# Patient Record
Sex: Male | Born: 1970 | Race: White | Hispanic: No | Marital: Single | Smoking: Current every day smoker
Health system: Southern US, Community
[De-identification: ages and names within clinical notes are randomized; demographics above are authoritative.]

---

## 2016-06-13 ENCOUNTER — Inpatient Hospital Stay (HOSPITAL_COMMUNITY): Payer: Self-pay | Admitting: Certified Registered"

## 2016-06-13 ENCOUNTER — Emergency Department (HOSPITAL_COMMUNITY): Payer: Self-pay

## 2016-06-13 ENCOUNTER — Inpatient Hospital Stay (HOSPITAL_COMMUNITY)
Admission: EM | Admit: 2016-06-13 | Discharge: 2016-06-19 | DRG: 494 | Disposition: A | Discharge status: Home / Self Care | Payer: Self-pay | Attending: Surgery | Admitting: Surgery

## 2016-06-13 ENCOUNTER — Encounter (HOSPITAL_COMMUNITY): Payer: Self-pay | Admitting: Emergency Medicine

## 2016-06-13 ENCOUNTER — Encounter (HOSPITAL_COMMUNITY): Admission: EM | Disposition: A | Discharge status: Home / Self Care | Payer: Self-pay | Source: Home / Self Care

## 2016-06-13 DIAGNOSIS — Z590 Homelessness: Secondary | ICD-10-CM

## 2016-06-13 DIAGNOSIS — F10129 Alcohol abuse with intoxication, unspecified: Secondary | ICD-10-CM | POA: Diagnosis present

## 2016-06-13 DIAGNOSIS — T148XXA Other injury of unspecified body region, initial encounter: Secondary | ICD-10-CM

## 2016-06-13 DIAGNOSIS — Z8673 Personal history of transient ischemic attack (TIA), and cerebral infarction without residual deficits: Secondary | ICD-10-CM

## 2016-06-13 DIAGNOSIS — S50811A Abrasion of right forearm, initial encounter: Secondary | ICD-10-CM | POA: Diagnosis present

## 2016-06-13 DIAGNOSIS — S60512A Abrasion of left hand, initial encounter: Secondary | ICD-10-CM | POA: Diagnosis present

## 2016-06-13 DIAGNOSIS — S82202B Unspecified fracture of shaft of left tibia, initial encounter for open fracture type I or II: Principal | ICD-10-CM | POA: Diagnosis present

## 2016-06-13 DIAGNOSIS — Z23 Encounter for immunization: Secondary | ICD-10-CM

## 2016-06-13 DIAGNOSIS — Z419 Encounter for procedure for purposes other than remedying health state, unspecified: Secondary | ICD-10-CM

## 2016-06-13 DIAGNOSIS — S0081XA Abrasion of other part of head, initial encounter: Secondary | ICD-10-CM | POA: Diagnosis present

## 2016-06-13 DIAGNOSIS — S0003XA Contusion of scalp, initial encounter: Secondary | ICD-10-CM | POA: Diagnosis present

## 2016-06-13 DIAGNOSIS — S82402B Unspecified fracture of shaft of left fibula, initial encounter for open fracture type I or II: Secondary | ICD-10-CM | POA: Diagnosis present

## 2016-06-13 DIAGNOSIS — T1490XA Injury, unspecified, initial encounter: Secondary | ICD-10-CM

## 2016-06-13 DIAGNOSIS — F17200 Nicotine dependence, unspecified, uncomplicated: Secondary | ICD-10-CM | POA: Diagnosis present

## 2016-06-13 DIAGNOSIS — F17210 Nicotine dependence, cigarettes, uncomplicated: Secondary | ICD-10-CM | POA: Diagnosis present

## 2016-06-13 DIAGNOSIS — S30810A Abrasion of lower back and pelvis, initial encounter: Secondary | ICD-10-CM | POA: Diagnosis present

## 2016-06-13 HISTORY — PX: TIBIA IM NAIL INSERTION: SHX2516

## 2016-06-13 HISTORY — PX: I & D EXTREMITY: SHX5045

## 2016-06-13 LAB — TYPE AND SCREEN
ABO/RH(D): O POS
Antibody Screen: NEGATIVE
UNIT DIVISION: 0
Unit division: 0

## 2016-06-13 LAB — PREPARE FRESH FROZEN PLASMA
Unit division: 0
Unit division: 0

## 2016-06-13 LAB — PROTIME-INR
INR: 1
Prothrombin Time: 13.2 seconds (ref 11.4–15.2)

## 2016-06-13 LAB — I-STAT CG4 LACTIC ACID, ED: Lactic Acid, Venous: 1.6 mmol/L (ref 0.5–1.9)

## 2016-06-13 LAB — URINALYSIS, ROUTINE W REFLEX MICROSCOPIC
Bilirubin Urine: NEGATIVE
Glucose, UA: NEGATIVE mg/dL
Hgb urine dipstick: NEGATIVE
Ketones, ur: NEGATIVE mg/dL
Leukocytes, UA: NEGATIVE
NITRITE: NEGATIVE
Protein, ur: NEGATIVE mg/dL
Specific Gravity, Urine: 1.015 (ref 1.005–1.030)
pH: 6 (ref 5.0–8.0)

## 2016-06-13 LAB — BPAM RBC
BLOOD PRODUCT EXPIRATION DATE: 201806192359
Blood Product Expiration Date: 201806192359
ISSUE DATE / TIME: 201805242136
ISSUE DATE / TIME: 201805242136
UNIT TYPE AND RH: 9500
Unit Type and Rh: 9500

## 2016-06-13 LAB — ABO/RH: ABO/RH(D): O POS

## 2016-06-13 LAB — BPAM FFP
BLOOD PRODUCT EXPIRATION DATE: 201806092359
Blood Product Expiration Date: 201806172359
ISSUE DATE / TIME: 201805242137
ISSUE DATE / TIME: 201805242137
UNIT TYPE AND RH: 6200
Unit Type and Rh: 600

## 2016-06-13 LAB — COMPREHENSIVE METABOLIC PANEL
ALT: 41 U/L (ref 17–63)
AST: 38 U/L (ref 15–41)
Albumin: 3.8 g/dL (ref 3.5–5.0)
Alkaline Phosphatase: 44 U/L (ref 38–126)
Anion gap: 8 (ref 5–15)
BILIRUBIN TOTAL: 0.2 mg/dL — AB (ref 0.3–1.2)
BUN: 12 mg/dL (ref 6–20)
CO2: 22 mmol/L (ref 22–32)
Calcium: 8.1 mg/dL — ABNORMAL LOW (ref 8.9–10.3)
Chloride: 109 mmol/L (ref 101–111)
Creatinine, Ser: 1.26 mg/dL — ABNORMAL HIGH (ref 0.61–1.24)
GFR calc Af Amer: >60 mL/min (ref >60)
Glucose, Bld: 92 mg/dL (ref 65–99)
POTASSIUM: 3.9 mmol/L (ref 3.5–5.1)
Sodium: 139 mmol/L (ref 135–145)
TOTAL PROTEIN: 6.5 g/dL (ref 6.5–8.1)

## 2016-06-13 LAB — CBC
HEMATOCRIT: 41.1 % (ref 39.0–52.0)
Hemoglobin: 13.9 g/dL (ref 13.0–17.0)
MCH: 31.7 pg (ref 26.0–34.0)
MCHC: 33.8 g/dL (ref 30.0–36.0)
MCV: 93.8 fL (ref 78.0–100.0)
PLATELETS: 215 10*3/uL (ref 150–400)
RBC: 4.38 MIL/uL (ref 4.22–5.81)
RDW: 12.9 % (ref 11.5–15.5)
WBC: 7.1 10*3/uL (ref 4.0–10.5)

## 2016-06-13 LAB — ETHANOL: Alcohol, Ethyl (B): 235 mg/dL — ABNORMAL HIGH (ref <5)

## 2016-06-13 LAB — I-STAT CHEM 8, ED
BUN: 13 mg/dL (ref 6–20)
CALCIUM ION: 1.04 mmol/L — AB (ref 1.15–1.40)
Chloride: 106 mmol/L (ref 101–111)
Creatinine, Ser: 1.4 mg/dL — ABNORMAL HIGH (ref 0.61–1.24)
Glucose, Bld: 87 mg/dL (ref 65–99)
HEMATOCRIT: 42 % (ref 39.0–52.0)
Hemoglobin: 14.3 g/dL (ref 13.0–17.0)
Potassium: 3.9 mmol/L (ref 3.5–5.1)
Sodium: 142 mmol/L (ref 135–145)
TCO2: 24 mmol/L (ref 0–100)

## 2016-06-13 SURGERY — INSERTION, INTRAMEDULLARY ROD, TIBIA
Anesthesia: General | Site: Leg Lower | Laterality: Left

## 2016-06-13 MED ORDER — CEFAZOLIN SODIUM-DEXTROSE 2-4 GM/100ML-% IV SOLN
2.0000 g | Freq: Once | INTRAVENOUS | Status: AC
  Administered 2016-06-13: 2 g via INTRAVENOUS

## 2016-06-13 MED ORDER — PROPOFOL 10 MG/ML IV BOLUS
INTRAVENOUS | Status: AC
  Filled 2016-06-13: qty 20

## 2016-06-13 MED ORDER — TETANUS-DIPHTH-ACELL PERTUSSIS 5-2.5-18.5 LF-MCG/0.5 IM SUSP
0.5000 mL | Freq: Once | INTRAMUSCULAR | Status: AC
  Administered 2016-06-13: 0.5 mL via INTRAMUSCULAR

## 2016-06-13 MED ORDER — ROCURONIUM BROMIDE 100 MG/10ML IV SOLN
INTRAVENOUS | Status: DC | PRN
  Administered 2016-06-13: 40 mg via INTRAVENOUS

## 2016-06-13 MED ORDER — SUCCINYLCHOLINE CHLORIDE 20 MG/ML IJ SOLN
INTRAMUSCULAR | Status: DC | PRN
  Administered 2016-06-13: 60 mg via INTRAVENOUS

## 2016-06-13 MED ORDER — FENTANYL CITRATE (PF) 100 MCG/2ML IJ SOLN
50.0000 ug | Freq: Once | INTRAMUSCULAR | Status: AC
  Administered 2016-06-13: 50 ug via INTRAVENOUS

## 2016-06-13 MED ORDER — MIDAZOLAM HCL 2 MG/2ML IJ SOLN
INTRAMUSCULAR | Status: AC
  Filled 2016-06-13: qty 2

## 2016-06-13 MED ORDER — SODIUM CHLORIDE 0.9 % IV BOLUS (SEPSIS)
125.0000 mL | Freq: Once | INTRAVENOUS | Status: AC
  Administered 2016-06-13: 1000 mL via INTRAVENOUS

## 2016-06-13 MED ORDER — FENTANYL CITRATE (PF) 250 MCG/5ML IJ SOLN
INTRAMUSCULAR | Status: AC
  Filled 2016-06-13: qty 5

## 2016-06-13 MED ORDER — FENTANYL CITRATE (PF) 100 MCG/2ML IJ SOLN
INTRAMUSCULAR | Status: DC | PRN
  Administered 2016-06-13: 150 ug via INTRAVENOUS
  Administered 2016-06-14: 100 ug via INTRAVENOUS
  Administered 2016-06-14: 50 ug via INTRAVENOUS

## 2016-06-13 MED ORDER — PROPOFOL 10 MG/ML IV BOLUS
INTRAVENOUS | Status: DC | PRN
  Administered 2016-06-13: 120 mg via INTRAVENOUS
  Administered 2016-06-14: 20 mg via INTRAVENOUS
  Administered 2016-06-14: 30 mg via INTRAVENOUS

## 2016-06-13 MED ORDER — IOPAMIDOL (ISOVUE-300) INJECTION 61%
100.0000 mL | Freq: Once | INTRAVENOUS | Status: AC | PRN
  Administered 2016-06-13: 100 mL via INTRAVENOUS

## 2016-06-13 MED ORDER — SODIUM CHLORIDE 0.9 % IR SOLN
Status: DC | PRN
  Administered 2016-06-13: 3000 mL

## 2016-06-13 SURGICAL SUPPLY — 78 items
BANDAGE ACE 4X5 VEL STRL LF (GAUZE/BANDAGES/DRESSINGS) ×3 IMPLANT
BANDAGE ACE 6X5 VEL STRL LF (GAUZE/BANDAGES/DRESSINGS) ×3 IMPLANT
BANDAGE ESMARK 6X9 LF (GAUZE/BANDAGES/DRESSINGS) IMPLANT
BIT DRILL 3.8X6 NS (BIT) ×3 IMPLANT
BIT DRILL 4.4 NS (BIT) ×3 IMPLANT
BNDG COHESIVE 4X5 TAN STRL (GAUZE/BANDAGES/DRESSINGS) ×3 IMPLANT
BNDG ESMARK 6X9 LF (GAUZE/BANDAGES/DRESSINGS)
BNDG GAUZE ELAST 4 BULKY (GAUZE/BANDAGES/DRESSINGS) ×3 IMPLANT
COVER SURGICAL LIGHT HANDLE (MISCELLANEOUS) ×6 IMPLANT
CUFF TOURNIQUET SINGLE 18IN (TOURNIQUET CUFF) ×3 IMPLANT
CUFF TOURNIQUET SINGLE 24IN (TOURNIQUET CUFF) IMPLANT
CUFF TOURNIQUET SINGLE 34IN LL (TOURNIQUET CUFF) IMPLANT
CUFF TOURNIQUET SINGLE 44IN (TOURNIQUET CUFF) IMPLANT
DRAPE C-ARM 42X72 X-RAY (DRAPES) ×3 IMPLANT
DRAPE IMP U-DRAPE 54X76 (DRAPES) ×3 IMPLANT
DRAPE INCISE IOBAN 66X45 STRL (DRAPES) ×3 IMPLANT
DRAPE ORTHO SPLIT 77X108 STRL (DRAPES) ×4
DRAPE SURG 17X23 STRL (DRAPES) ×3 IMPLANT
DRAPE SURG ORHT 6 SPLT 77X108 (DRAPES) ×2 IMPLANT
DRAPE U-SHAPE 47X51 STRL (DRAPES) ×3 IMPLANT
DRSG ADAPTIC 3X8 NADH LF (GAUZE/BANDAGES/DRESSINGS) ×3 IMPLANT
DRSG EMULSION OIL 3X3 NADH (GAUZE/BANDAGES/DRESSINGS) ×3 IMPLANT
DRSG MEPILEX BORDER 4X4 (GAUZE/BANDAGES/DRESSINGS) ×3 IMPLANT
DRSG MEPILEX BORDER 4X8 (GAUZE/BANDAGES/DRESSINGS) ×3 IMPLANT
DRSG PAD ABDOMINAL 8X10 ST (GAUZE/BANDAGES/DRESSINGS) ×6 IMPLANT
ELECT REM PT RETURN 9FT ADLT (ELECTROSURGICAL) ×3
ELECTRODE REM PT RTRN 9FT ADLT (ELECTROSURGICAL) ×1 IMPLANT
EVACUATOR 1/8 PVC DRAIN (DRAIN) IMPLANT
FACESHIELD WRAPAROUND (MASK) ×3 IMPLANT
GAUZE SPONGE 4X4 12PLY STRL (GAUZE/BANDAGES/DRESSINGS) ×3 IMPLANT
GLOVE BIOGEL PI IND STRL 7.5 (GLOVE) ×1 IMPLANT
GLOVE BIOGEL PI IND STRL 8 (GLOVE) ×1 IMPLANT
GLOVE BIOGEL PI INDICATOR 7.5 (GLOVE) ×2
GLOVE BIOGEL PI INDICATOR 8 (GLOVE) ×2
GLOVE ECLIPSE 8.0 STRL XLNG CF (GLOVE) ×3 IMPLANT
GLOVE ORTHO TXT STRL SZ7.5 (GLOVE) ×3 IMPLANT
GLOVE SKINSENSE NS SZ8.0 LF (GLOVE) ×4
GLOVE SKINSENSE STRL SZ8.0 LF (GLOVE) ×2 IMPLANT
GLOVE SURG ORTHO 8.0 STRL STRW (GLOVE) ×3 IMPLANT
GOWN STRL REIN 3XL XLG LVL4 (GOWN DISPOSABLE) ×3 IMPLANT
GOWN STRL REUS W/ TWL LRG LVL3 (GOWN DISPOSABLE) ×3 IMPLANT
GOWN STRL REUS W/TWL LRG LVL3 (GOWN DISPOSABLE) ×6
GUIDEWIRE BALL NOSE 80CM (WIRE) ×3 IMPLANT
HANDPIECE INTERPULSE COAX TIP (DISPOSABLE) ×2
KIT BASIN OR (CUSTOM PROCEDURE TRAY) ×3 IMPLANT
KIT ROOM TURNOVER OR (KITS) ×3 IMPLANT
MANIFOLD NEPTUNE II (INSTRUMENTS) ×3 IMPLANT
NAIL TIBIAL 9MMX34.5CM (Nail) ×3 IMPLANT
NS IRRIG 1000ML POUR BTL (IV SOLUTION) ×3 IMPLANT
PACK ORTHO EXTREMITY (CUSTOM PROCEDURE TRAY) ×3 IMPLANT
PACK UNIVERSAL I (CUSTOM PROCEDURE TRAY) ×3 IMPLANT
PAD ARMBOARD 7.5X6 YLW CONV (MISCELLANEOUS) ×6 IMPLANT
SCREW ACECAP 38MM (Screw) ×3 IMPLANT
SCREW ACECAP 42MM (Screw) ×3 IMPLANT
SCREW CORTICAL 5.5 35MM (Screw) ×3 IMPLANT
SET HNDPC FAN SPRY TIP SCT (DISPOSABLE) ×1 IMPLANT
SPONGE LAP 18X18 X RAY DECT (DISPOSABLE) ×3 IMPLANT
SPONGE LAP 4X18 X RAY DECT (DISPOSABLE) ×3 IMPLANT
STAPLER VISISTAT 35W (STAPLE) IMPLANT
STOCKINETTE IMPERVIOUS 9X36 MD (GAUZE/BANDAGES/DRESSINGS) ×3 IMPLANT
STOCKINETTE TUBULAR 6 INCH (GAUZE/BANDAGES/DRESSINGS) ×3 IMPLANT
SUCTION FRAZIER HANDLE 10FR (MISCELLANEOUS)
SUCTION TUBE FRAZIER 10FR DISP (MISCELLANEOUS) IMPLANT
SUT ETHILON 3 0 PS 1 (SUTURE) ×3 IMPLANT
SUT PROLENE 3 0 PS 2 (SUTURE) IMPLANT
SUT SILK 2 0 FS (SUTURE) ×3 IMPLANT
SUT VIC AB 0 CT1 27 (SUTURE)
SUT VIC AB 0 CT1 27XBRD ANBCTR (SUTURE) IMPLANT
SUT VIC AB 2-0 CT1 27 (SUTURE)
SUT VIC AB 2-0 CT1 TAPERPNT 27 (SUTURE) IMPLANT
SWAB CULTURE ESWAB REG 1ML (MISCELLANEOUS) IMPLANT
TOWEL OR 17X24 6PK STRL BLUE (TOWEL DISPOSABLE) ×3 IMPLANT
TOWEL OR 17X26 10 PK STRL BLUE (TOWEL DISPOSABLE) ×9 IMPLANT
TUBE CONNECTING 12'X1/4 (SUCTIONS) ×1
TUBE CONNECTING 12X1/4 (SUCTIONS) ×2 IMPLANT
UNDERPAD 30X30 (UNDERPADS AND DIAPERS) ×3 IMPLANT
WATER STERILE IRR 1000ML POUR (IV SOLUTION) ×3 IMPLANT
YANKAUER SUCT BULB TIP NO VENT (SUCTIONS) ×3 IMPLANT

## 2016-06-13 NOTE — Progress Notes (Signed)
Orthopedic Tech Progress Note Patient Details:  Zachary Ho [birth year] [id number]  Ortho Devices Type of Ortho Device: Ace wrap, Stirrup splint, Post (short leg) splint Ortho Device/Splint Location: LLE Ortho Device/Splint Interventions: Ordered, Application   Jennye MoccasinHughes, [name] 06/13/2016, 10:17 PM

## 2016-06-13 NOTE — ED Notes (Signed)
[place] law [employer] at bedside.
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
## 2016-06-13 NOTE — Brief Op Note (Signed)
06/13/2016  11:45 PM  PATIENT:  Zachary Ho  46 y.o. male  PRE-OPERATIVE DIAGNOSIS:  Open left tibia/fibula fracture  POST-OPERATIVE DIAGNOSIS:  Grade II open left tibia fracture  PROCEDURE:  Procedure(s): INTRAMEDULLARY (IM) NAIL TIBIAL (Left) IRRIGATION AND DEBRIDEMENT EXTREMITY (Left)  SURGEON:  Surgeon(s) and Role:    Zachary Romans* Chales Pelissier, MD - Primary  PHYSICIAN ASSISTANT: None  ANESTHESIA:   general  EBL:  Total I/O In: 1000 [I.V.:1000] Out: -  200 cc   BLOOD ADMINISTERED:none  DRAINS: none   LOCAL MEDICATIONS USED:  NONE  SPECIMEN:  No Specimen  DISPOSITION OF SPECIMEN:  N/A  COUNTS:  YES  TOURNIQUET:   33 min at 250 mmHg  DICTATION: .Other Dictation: Dictation Number [id number]  PLAN OF CARE: Admit to inpatient   PATIENT DISPOSITION:  PACU - hemodynamically stable.   Delay start of Pharmacological VTE agent (>24hrs) due to surgical blood loss or risk of bleeding: no
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
## 2016-06-13 NOTE — ED Notes (Signed)
Patient with abrasion above left and right eyes, mid forehead, right temporal area, right elbow, left hands. Laceration to right upper buttock, superficial.

## 2016-06-13 NOTE — ED Notes (Signed)
Returned from CT.

## 2016-06-14 ENCOUNTER — Inpatient Hospital Stay (HOSPITAL_COMMUNITY): Payer: Self-pay

## 2016-06-14 ENCOUNTER — Encounter (HOSPITAL_COMMUNITY): Payer: Self-pay | Admitting: Orthopedic Surgery

## 2016-06-14 LAB — BASIC METABOLIC PANEL
Anion gap: 8 (ref 5–15)
BUN: 7 mg/dL (ref 6–20)
CO2: 19 mmol/L — ABNORMAL LOW (ref 22–32)
CREATININE: 0.82 mg/dL (ref 0.61–1.24)
Calcium: 6.9 mg/dL — ABNORMAL LOW (ref 8.9–10.3)
Chloride: 114 mmol/L — ABNORMAL HIGH (ref 101–111)
GFR calc Af Amer: >60 mL/min (ref >60)
GLUCOSE: 77 mg/dL (ref 65–99)
POTASSIUM: 3.5 mmol/L (ref 3.5–5.1)
SODIUM: 141 mmol/L (ref 135–145)

## 2016-06-14 LAB — CBC
HCT: 32.6 % — ABNORMAL LOW (ref 39.0–52.0)
Hemoglobin: 11.1 g/dL — ABNORMAL LOW (ref 13.0–17.0)
MCH: 32.2 pg (ref 26.0–34.0)
MCHC: 34 g/dL (ref 30.0–36.0)
MCV: 94.5 fL (ref 78.0–100.0)
PLATELETS: 179 10*3/uL (ref 150–400)
RBC: 3.45 MIL/uL — AB (ref 4.22–5.81)
RDW: 13.5 % (ref 11.5–15.5)
WBC: 11.6 10*3/uL — ABNORMAL HIGH (ref 4.0–10.5)

## 2016-06-14 LAB — CDS SEROLOGY

## 2016-06-14 LAB — HIV ANTIBODY (ROUTINE TESTING W REFLEX): HIV Screen 4th Generation wRfx: NONREACTIVE

## 2016-06-14 LAB — BLOOD PRODUCT ORDER (VERBAL) VERIFICATION

## 2016-06-14 MED ORDER — SUCCINYLCHOLINE CHLORIDE 200 MG/10ML IV SOSY
PREFILLED_SYRINGE | INTRAVENOUS | Status: AC
  Filled 2016-06-14: qty 10

## 2016-06-14 MED ORDER — ENOXAPARIN SODIUM 40 MG/0.4ML ~~LOC~~ SOLN: 40.0000 mg | SUBCUTANEOUS | Status: DC

## 2016-06-14 MED ORDER — OXYCODONE HCL 5 MG/5ML PO SOLN: 5.0000 mg | Freq: Once | ORAL | Status: DC | PRN

## 2016-06-14 MED ORDER — ONDANSETRON HCL 4 MG/2ML IJ SOLN
INTRAMUSCULAR | Status: DC | PRN
  Administered 2016-06-14: 4 mg via INTRAVENOUS

## 2016-06-14 MED ORDER — ONDANSETRON HCL 4 MG/2ML IJ SOLN: 4.0000 mg | Freq: Four times a day (QID) | INTRAMUSCULAR | Status: DC | PRN

## 2016-06-14 MED ORDER — SODIUM CHLORIDE 0.9 % IV SOLN
INTRAVENOUS | Status: DC
  Administered 2016-06-14 – 2016-06-15 (×2): via INTRAVENOUS

## 2016-06-14 MED ORDER — EPHEDRINE SULFATE 50 MG/ML IJ SOLN
INTRAMUSCULAR | Status: DC | PRN
  Administered 2016-06-14: 10 mg via INTRAVENOUS
  Administered 2016-06-14: 5 mg via INTRAVENOUS

## 2016-06-14 MED ORDER — HYDROMORPHONE HCL 1 MG/ML IJ SOLN
0.5000 mg | INTRAMUSCULAR | Status: DC | PRN
  Administered 2016-06-14 – 2016-06-15 (×5): 1 mg via INTRAVENOUS
  Filled 2016-06-14 (×5): qty 1

## 2016-06-14 MED ORDER — PHENYLEPHRINE 40 MCG/ML (10ML) SYRINGE FOR IV PUSH (FOR BLOOD PRESSURE SUPPORT)
PREFILLED_SYRINGE | INTRAVENOUS | Status: AC
  Filled 2016-06-14: qty 10

## 2016-06-14 MED ORDER — ONDANSETRON HCL 4 MG PO TABS: 4.0000 mg | ORAL_TABLET | Freq: Four times a day (QID) | ORAL | Status: DC | PRN

## 2016-06-14 MED ORDER — ACETAMINOPHEN 325 MG PO TABS
650.0000 mg | ORAL_TABLET | Freq: Four times a day (QID) | ORAL | Status: DC | PRN
  Administered 2016-06-15 – 2016-06-18 (×8): 650 mg via ORAL
  Filled 2016-06-14 (×8): qty 2

## 2016-06-14 MED ORDER — LORAZEPAM 1 MG PO TABS: 1.0000 mg | ORAL_TABLET | Freq: Four times a day (QID) | ORAL | Status: AC | PRN

## 2016-06-14 MED ORDER — EPHEDRINE 5 MG/ML INJ
INTRAVENOUS | Status: AC
  Filled 2016-06-14: qty 10

## 2016-06-14 MED ORDER — OXYCODONE HCL 5 MG PO TABS: 5.0000 mg | ORAL_TABLET | Freq: Once | ORAL | Status: DC | PRN

## 2016-06-14 MED ORDER — METHOCARBAMOL 500 MG PO TABS
500.0000 mg | ORAL_TABLET | Freq: Four times a day (QID) | ORAL | Status: DC | PRN
  Administered 2016-06-14 – 2016-06-19 (×10): 500 mg via ORAL
  Filled 2016-06-14 (×10): qty 1

## 2016-06-14 MED ORDER — ROCURONIUM BROMIDE 10 MG/ML (PF) SYRINGE
PREFILLED_SYRINGE | INTRAVENOUS | Status: AC
  Filled 2016-06-14: qty 5

## 2016-06-14 MED ORDER — SUGAMMADEX SODIUM 200 MG/2ML IV SOLN
INTRAVENOUS | Status: DC | PRN
  Administered 2016-06-14: 200 mg via INTRAVENOUS

## 2016-06-14 MED ORDER — MEPERIDINE HCL 25 MG/ML IJ SOLN
6.2500 mg | INTRAMUSCULAR | Status: DC | PRN
  Administered 2016-06-14: 6.25 mg via INTRAVENOUS

## 2016-06-14 MED ORDER — FENTANYL CITRATE (PF) 250 MCG/5ML IJ SOLN
INTRAMUSCULAR | Status: AC
  Filled 2016-06-14: qty 5

## 2016-06-14 MED ORDER — LACTATED RINGERS IV SOLN
INTRAVENOUS | Status: DC | PRN
  Administered 2016-06-13 – 2016-06-14 (×3): via INTRAVENOUS

## 2016-06-14 MED ORDER — HYDROMORPHONE HCL 1 MG/ML IJ SOLN: 1.0000 mg | INTRAMUSCULAR | Status: DC | PRN

## 2016-06-14 MED ORDER — HYDROMORPHONE HCL 1 MG/ML IJ SOLN: 0.2500 mg | INTRAMUSCULAR | Status: DC | PRN

## 2016-06-14 MED ORDER — SUGAMMADEX SODIUM 200 MG/2ML IV SOLN
INTRAVENOUS | Status: AC
  Filled 2016-06-14: qty 2

## 2016-06-14 MED ORDER — ENOXAPARIN SODIUM 40 MG/0.4ML ~~LOC~~ SOLN
40.0000 mg | Freq: Every day | SUBCUTANEOUS | Status: DC
  Administered 2016-06-14 – 2016-06-15 (×2): 40 mg via SUBCUTANEOUS
  Filled 2016-06-14 (×3): qty 0.4

## 2016-06-14 MED ORDER — METOCLOPRAMIDE HCL 5 MG PO TABS: 5.0000 mg | ORAL_TABLET | Freq: Three times a day (TID) | ORAL | Status: DC | PRN

## 2016-06-14 MED ORDER — OXYCODONE HCL 5 MG PO TABS: 10.0000 mg | ORAL_TABLET | ORAL | Status: DC | PRN

## 2016-06-14 MED ORDER — LIDOCAINE HCL (CARDIAC) 20 MG/ML IV SOLN
INTRAVENOUS | Status: DC | PRN
Start: 2016-06-13 — End: 2016-06-14
  Administered 2016-06-13: 30 mg via INTRAVENOUS

## 2016-06-14 MED ORDER — LIDOCAINE 2% (20 MG/ML) 5 ML SYRINGE
INTRAMUSCULAR | Status: AC
  Filled 2016-06-14: qty 5

## 2016-06-14 MED ORDER — NICOTINE 14 MG/24HR TD PT24
14.0000 mg | MEDICATED_PATCH | Freq: Every day | TRANSDERMAL | Status: DC
  Administered 2016-06-14 – 2016-06-17 (×4): 14 mg via TRANSDERMAL
  Filled 2016-06-14 (×4): qty 1

## 2016-06-14 MED ORDER — OXYCODONE HCL 5 MG PO TABS: 5.0000 mg | ORAL_TABLET | ORAL | Status: DC | PRN

## 2016-06-14 MED ORDER — FOLIC ACID 1 MG PO TABS
1.0000 mg | ORAL_TABLET | Freq: Every day | ORAL | Status: DC
  Administered 2016-06-14 – 2016-06-19 (×6): 1 mg via ORAL
  Filled 2016-06-14 (×6): qty 1

## 2016-06-14 MED ORDER — ADULT MULTIVITAMIN W/MINERALS CH
1.0000 | ORAL_TABLET | Freq: Every day | ORAL | Status: DC
  Administered 2016-06-14 – 2016-06-19 (×6): 1 via ORAL
  Filled 2016-06-14 (×6): qty 1

## 2016-06-14 MED ORDER — METOCLOPRAMIDE HCL 5 MG/ML IJ SOLN: 5.0000 mg | Freq: Three times a day (TID) | INTRAMUSCULAR | Status: DC | PRN

## 2016-06-14 MED ORDER — PHENYLEPHRINE HCL 10 MG/ML IJ SOLN
INTRAMUSCULAR | Status: DC | PRN
  Administered 2016-06-14: 120 ug via INTRAVENOUS
  Administered 2016-06-14 (×3): 80 ug via INTRAVENOUS

## 2016-06-14 MED ORDER — VITAMIN B-1 100 MG PO TABS
100.0000 mg | ORAL_TABLET | Freq: Every day | ORAL | Status: DC
  Administered 2016-06-14 – 2016-06-19 (×6): 100 mg via ORAL
  Filled 2016-06-14 (×6): qty 1

## 2016-06-14 MED ORDER — CEFAZOLIN SODIUM 1 G IJ SOLR
INTRAMUSCULAR | Status: DC | PRN
  Administered 2016-06-14: 2 g via INTRAMUSCULAR

## 2016-06-14 MED ORDER — LORAZEPAM 2 MG/ML IJ SOLN: 1.0000 mg | Freq: Four times a day (QID) | INTRAMUSCULAR | Status: AC | PRN

## 2016-06-14 MED ORDER — THIAMINE HCL 100 MG/ML IJ SOLN
100.0000 mg | Freq: Every day | INTRAMUSCULAR | Status: DC
  Filled 2016-06-14: qty 2

## 2016-06-14 MED ORDER — MEPERIDINE HCL 25 MG/ML IJ SOLN
INTRAMUSCULAR | Status: AC
  Filled 2016-06-14: qty 1

## 2016-06-14 MED ORDER — SODIUM CHLORIDE 0.9 % IV SOLN
INTRAVENOUS | Status: DC
  Administered 2016-06-14: 03:00:00 via INTRAVENOUS

## 2016-06-14 MED ORDER — ONDANSETRON HCL 4 MG/2ML IJ SOLN
INTRAMUSCULAR | Status: AC
  Filled 2016-06-14: qty 2

## 2016-06-14 MED ORDER — ACETAMINOPHEN 650 MG RE SUPP: 650.0000 mg | Freq: Four times a day (QID) | RECTAL | Status: DC | PRN

## 2016-06-14 MED ORDER — METHOCARBAMOL 1000 MG/10ML IJ SOLN: 500.0000 mg | Freq: Four times a day (QID) | INTRAVENOUS | Status: DC | PRN

## 2016-06-14 MED ORDER — DOCUSATE SODIUM 100 MG PO CAPS
100.0000 mg | ORAL_CAPSULE | Freq: Two times a day (BID) | ORAL | Status: DC
  Administered 2016-06-14 – 2016-06-19 (×10): 100 mg via ORAL
  Filled 2016-06-14 (×10): qty 1

## 2016-06-14 MED ORDER — CEFAZOLIN SODIUM-DEXTROSE 1-4 GM/50ML-% IV SOLN
1.0000 g | Freq: Four times a day (QID) | INTRAVENOUS | Status: AC
Start: 2016-06-14 — End: 2016-06-16
  Administered 2016-06-14 – 2016-06-15 (×8): 1 g via INTRAVENOUS
  Filled 2016-06-14 (×8): qty 50

## 2016-06-14 MED ORDER — HYDROCODONE-ACETAMINOPHEN 5-325 MG PO TABS
1.0000 | ORAL_TABLET | ORAL | Status: DC | PRN
  Administered 2016-06-14 (×2): 2 via ORAL
  Filled 2016-06-14 (×2): qty 2

## 2016-06-14 NOTE — Anesthesia Postprocedure Evaluation (Addendum)
Anesthesia Post Note  Patient: Zachary Ho  Procedure(s) Performed: Procedure(s) (LRB): INTRAMEDULLARY (IM) NAIL TIBIAL (Left) IRRIGATION AND DEBRIDEMENT EXTREMITY (Left)  Patient location during evaluation: PACU Anesthesia Type: General Level of consciousness: awake Pain management: pain level controlled Vital Signs Assessment: post-procedure vital signs reviewed and stable Respiratory status: spontaneous breathing, nonlabored ventilation, respiratory function stable and patient connected to nasal cannula oxygen Cardiovascular status: blood pressure returned to baseline and stable Postop Assessment: no signs of nausea or vomiting Anesthetic complications: no       Last Vitals:  Vitals:   06/14/16 0300 06/14/16 0330  BP:  133/77  Pulse:  76  Resp: 20 20  Temp:  36.4 C    Last Pain:  Vitals:   06/14/16 0330  TempSrc: Axillary  PainSc:                  [name]

## 2016-06-14 NOTE — Transfer of Care (Signed)
Immediate Anesthesia Transfer of Care Note  Patient: Zachary Ho  Procedure(s) Performed: Procedure(s): INTRAMEDULLARY (IM) NAIL TIBIAL (Left) IRRIGATION AND DEBRIDEMENT EXTREMITY (Left)  Patient Location: PACU  Anesthesia Type:General  Level of Consciousness: awake, alert  and confused  Airway & Oxygen Therapy: Patient Spontanous Breathing and Patient connected to nasal cannula oxygen  Post-op Assessment: Report given to RN and Post -op Vital signs reviewed and stable  Post vital signs: Reviewed and stable  Last Vitals:  Vitals:   06/13/16 2345 06/14/16 0230  BP: 126/87 (!) (P) 143/84  Pulse: 89   Resp: 16   Temp:  (P) 36.4 C    Last Pain:  Vitals:   06/14/16 0230  TempSrc:   PainSc: (P) 0-No pain         Complications: No apparent anesthesia complications
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
## 2016-06-14 NOTE — Progress Notes (Signed)
Patient ID: Zachary Ho, male   DOB: 1970/11/14, 46 y.o.   MRN: [id number]   LOS: 1 day   Subjective: Doing well this morning, a little sore but pain controlled.   Objective: Vital signs in last 24 hours: Temp:  [97.5 F (36.4 C)-98.2 F (36.8 C)] 97.5 F (36.4 C) (05/25 0330) Pulse Rate:  [76-94] 76 (05/25 0330) Resp:  [12-22] 20 (05/25 0330) BP: (111-153)/(72-105) 133/77 (05/25 0330) SpO2:  [90 %-98 %] 98 % (05/25 0330) Weight:  [68 kg (150 lb)] 68 kg (150 lb) (05/24 2215) Last BM Date: 06/13/16   Laboratory  CBC  Recent Labs  06/13/16 2205 06/13/16 2213 06/14/16 0620  WBC 7.1  --  11.6*  HGB 13.9 14.3 11.1*  HCT 41.1 42.0 32.6*  PLT 215  --  179   BMET  Recent Labs  06/13/16 2205 06/13/16 2213 06/14/16 0620  NA 139 142 141  K 3.9 3.9 3.5  CL 109 106 114*  CO2 22  --  19*  GLUCOSE 92 87 77  BUN 12 13 7   CREATININE 1.26* 1.40* 0.82  CALCIUM 8.1*  --  6.9*     Physical Exam General appearance: alert and no distress  LLE Splint intact  Sens DPN, SPN, TN intact  Motor EHL 4/5  Cap refill <2s, No significant edema    Assessment/Plan: PHBC Open left tibia fx s/p IMN -- 50% WB per Dr. Charlann Boxerlin ABL anemia -- Mild EtOH use    Freeman CaldronMichael J. Maher Shon, PA-C Orthopedic Surgery [phone] 06/14/2016

## 2016-06-14 NOTE — Anesthesia Preprocedure Evaluation (Signed)
Anesthesia Evaluation  Patient identified by MRN, date of birth, ID bandGeneral Assessment Comment:Awake and intoxicated  Reviewed: Patient's Chart, lab work & pertinent test results, Unable to perform ROS - Chart review onlyPreop documentation limited or incomplete due to emergent nature of procedure.  History of Anesthesia Complications Negative for: history of anesthetic complications  Airway Mallampati: II  TM Distance: >3 FB Neck ROM: Full    Dental  (+) Edentulous Upper, Edentulous Lower   Pulmonary Current Smoker,    breath sounds clear to auscultation       Cardiovascular  Rhythm:Regular     Neuro/Psych    GI/Hepatic (+)     substance abuse  alcohol use,   Endo/Other    Renal/GU Renal InsufficiencyRenal disease     Musculoskeletal Open left tib fx   Abdominal   Peds  Hematology negative hematology ROS (+)   Anesthesia Other Findings   Reproductive/Obstetrics                             Anesthesia Physical Anesthesia Plan  ASA: III and emergent  Anesthesia Plan: General   Post-op Pain Management:    Induction: Intravenous, Rapid sequence and Cricoid pressure planned  Airway Management Planned: Oral ETT  Additional Equipment: None  Intra-op Plan:   Post-operative Plan: Extubation in OR  Informed Consent: I have reviewed the patients History and Physical, chart, labs and discussed the procedure including the risks, benefits and alternatives for the proposed anesthesia with the patient or authorized representative who has indicated his/her understanding and acceptance.   Dental advisory given  Plan Discussed with: CRNA and Surgeon  Anesthesia Plan Comments:         Anesthesia Quick Evaluation

## 2016-06-14 NOTE — Anesthesia Procedure Notes (Signed)
Procedure Name: Intubation Date/Time: 06/14/2016 11:55 PM Performed by: Manuela Schwartz B Pre-anesthesia Checklist: Patient identified, Suction available, Emergency Drugs available, Patient being monitored and Timeout performed Patient Re-evaluated:Patient Re-evaluated prior to inductionOxygen Delivery Method: Circle system utilized Preoxygenation: Pre-oxygenation with 100% oxygen Intubation Type: IV induction and Rapid sequence Laryngoscope Size: Mac and 3 Grade View: Grade I Tube type: Oral Tube size: 7.5 mm Number of attempts: 1 Airway Equipment and Method: Stylet Placement Confirmation: ETT inserted through vocal cords under direct vision,  breath sounds checked- equal and bilateral and positive ETCO2 Secured at: 22 cm Tube secured with: Tape Dental Injury: Teeth and Oropharynx as per pre-operative assessment

## 2016-06-15 MED ORDER — HYDROMORPHONE HCL 1 MG/ML IJ SOLN
1.0000 mg | INTRAMUSCULAR | Status: DC | PRN
  Administered 2016-06-15: 1 mg via INTRAVENOUS
  Filled 2016-06-15: qty 1

## 2016-06-15 MED ORDER — OXYCODONE HCL 5 MG PO TABS
5.0000 mg | ORAL_TABLET | ORAL | Status: DC | PRN
  Administered 2016-06-15 – 2016-06-16 (×2): 10 mg via ORAL
  Administered 2016-06-17 – 2016-06-18 (×2): 5 mg via ORAL
  Administered 2016-06-18 – 2016-06-19 (×4): 10 mg via ORAL
  Filled 2016-06-15 (×2): qty 1
  Filled 2016-06-15 (×2): qty 2
  Filled 2016-06-15: qty 1
  Filled 2016-06-15 (×2): qty 2
  Filled 2016-06-15: qty 1
  Filled 2016-06-15: qty 2

## 2016-06-15 NOTE — Progress Notes (Signed)
Patient ID: Zachary Ho, male   DOB: 1970-02-06, 46 y.o.   MRN: [id number] Subjective: 2 Days Post-Op Procedure(s) (LRB): INTRAMEDULLARY (IM) NAIL TIBIAL (Left) IRRIGATION AND DEBRIDEMENT EXTREMITY (Left)    Patient reports pain as moderate. Up eating breakfast this am.  Reviewed operative findings and concerns.  States he was up on leg a little yesterday without significant increase in his pain  Objective:   VITALS:   Vitals:   06/14/16 2048 06/15/16 0559  BP: (!) 160/98 (!) 149/90  Pulse: 85 88  Resp:    Temp: 99.3 F (37.4 C) 99.4 F (37.4 C)    Neurovascular intact Incision: Left leg splint clean, dry, no bloody drainage  LABS  Recent Labs  06/13/16 2205 06/13/16 2213 06/14/16 0620  HGB 13.9 14.3 11.1*  HCT 41.1 42.0 32.6*  WBC 7.1  --  11.6*  PLT 215  --  179     Recent Labs  06/13/16 2205 06/13/16 2213 06/14/16 0620  NA 139 142 141  K 3.9 3.9 3.5  BUN 12 13 7   CREATININE 1.26* 1.40* 0.82  GLUCOSE 92 87 77     Recent Labs  06/13/16 2205  INR 1.00     Assessment/Plan: 2 Days Post-Op Procedure(s) (LRB): INTRAMEDULLARY (IM) NAIL TIBIAL (Left) IRRIGATION AND DEBRIDEMENT EXTREMITY (Left)   Up with therapy Discharge to SNF at some point in near future Finish 48hr of IV antibiotics Maintain current splint, I will take down either Sunday or Monday to evaluate wound Concerned about infection despite aggressive debridement intra-operatively PWB LLE
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
## 2016-06-16 MED ORDER — CEPHALEXIN 500 MG PO CAPS
500.0000 mg | ORAL_CAPSULE | Freq: Four times a day (QID) | ORAL | Status: DC
Start: 2016-06-16 — End: 2016-06-19
  Administered 2016-06-16 – 2016-06-19 (×14): 500 mg via ORAL
  Filled 2016-06-16 (×14): qty 1

## 2016-06-16 NOTE — Progress Notes (Signed)
  [facility]  Assessment & Plan: POD#3 - (IM) NAIL TIBIAL, IRRIGATION AND DEBRIDEMENT EXTREMITY  Ortho planning dressing change tomorrow, possible return to OR  Oral abx's per ortho  Tolerating diet.  May need SNF placement pending orthopedic assessment.        Velora Hecklerodd M. Arabelle Bollig, MD, [facility]       [facility]       Office: [phone]    Chief Complaint: Left tib fib fracture, wound  Subjective: Patient in bed, sleeping, arouses easily, tolerating diet, no BM yet.  Objective: Vital signs in last 24 hours: Temp:  [99.2 F (37.3 C)-101.8 F (38.8 C)] 99.3 F (37.4 C) (05/27 0604) Pulse Rate:  [83-97] 91 (05/27 0604) Resp:  [16-17] 16 (05/27 0604) BP: (127-166)/(84-90) 127/84 (05/27 0604) SpO2:  [99 %-100 %] 99 % (05/27 0604) Last BM Date: 06/13/16  Intake/Output from previous day: 05/26 0701 - 05/27 0700 In: 1420 [P.O.:1320; IV Piggyback:100] Out: 1750 [Urine:1750] Intake/Output this shift: No intake/output data recorded.  Physical Exam: HEENT - sclerae clear, mucous membranes moist Neck - soft Ext - dressing left LE intact and dry Neuro - alert & oriented, no focal deficits  Lab Results:   Recent Labs  06/13/16 2205 06/13/16 2213 06/14/16 0620  WBC 7.1  --  11.6*  HGB 13.9 14.3 11.1*  HCT 41.1 42.0 32.6*  PLT 215  --  179   BMET  Recent Labs  06/13/16 2205 06/13/16 2213 06/14/16 0620  NA 139 142 141  K 3.9 3.9 3.5  CL 109 106 114*  CO2 22  --  19*  GLUCOSE 92 87 77  BUN 12 13 7   CREATININE 1.26* 1.40* 0.82  CALCIUM 8.1*  --  6.9*   PT/INR  Recent Labs  06/13/16 2205  LABPROT 13.2  INR 1.00   Comprehensive Metabolic Panel:    Component Value Date/Time   NA 141 06/14/2016 0620   NA 142 06/13/2016 2213   K 3.5 06/14/2016 0620   K 3.9 06/13/2016 2213   CL 114 (H) 06/14/2016 0620   CL 106 06/13/2016 2213   CO2 19 (L) 06/14/2016 0620   CO2 22 06/13/2016 2205   BUN 7 06/14/2016  0620   BUN 13 06/13/2016 2213   CREATININE 0.82 06/14/2016 0620   CREATININE 1.40 (H) 06/13/2016 2213   GLUCOSE 77 06/14/2016 0620   GLUCOSE 87 06/13/2016 2213   CALCIUM 6.9 (L) 06/14/2016 0620   CALCIUM 8.1 (L) 06/13/2016 2205   AST 38 06/13/2016 2205   ALT 41 06/13/2016 2205   ALKPHOS 44 06/13/2016 2205   BILITOT 0.2 (L) 06/13/2016 2205   PROT 6.5 06/13/2016 2205   ALBUMIN 3.8 06/13/2016 2205    Studies/Results: No results found.    Clifford Coudriet M 06/16/2016  Patient ID: Zachary Ho, male   DOB: 18-Jan-1971, 46 y.o.   MRN: [id number]

## 2016-06-16 NOTE — Progress Notes (Signed)
Patient ID: Zachary Ho, male   DOB: 01-Apr-1970, 46 y.o.   MRN: [id number] Subjective: 3 Days Post-Op Procedure(s) (LRB): INTRAMEDULLARY (IM) NAIL TIBIAL (Left) IRRIGATION AND DEBRIDEMENT EXTREMITY (Left)    Patient reports pain as mild.  No events  Objective:   VITALS:   Vitals:   06/15/16 2258 06/16/16 0604  BP: 134/87 127/84  Pulse: 97 91  Resp: 16 16  Temp: (!) 101.8 F (38.8 C) 99.3 F (37.4 C)    Sensation intact distally  Temp of 101.8 noted Splint intact  LABS  Recent Labs  06/13/16 2205 06/13/16 2213 06/14/16 0620  HGB 13.9 14.3 11.1*  HCT 41.1 42.0 32.6*  WBC 7.1  --  11.6*  PLT 215  --  179     Recent Labs  06/13/16 2205 06/13/16 2213 06/14/16 0620  NA 139 142 141  K 3.9 3.9 3.5  BUN 12 13 7   CREATININE 1.26* 1.40* 0.82  GLUCOSE 92 87 77     Recent Labs  06/13/16 2205  INR 1.00     Assessment/Plan: 3 Days Post-Op Procedure(s) (LRB): INTRAMEDULLARY (IM) NAIL TIBIAL (Left) IRRIGATION AND DEBRIDEMENT EXTREMITY (Left)  Plan: Want to take splint down tomorrow to look at wound May need to go back to OR for wash out, may consult [facility] and or [facility] group if needed depending on wound appearance tomorrow Can switch to PO keflex for now

## 2016-06-17 ENCOUNTER — Encounter (HOSPITAL_COMMUNITY): Payer: Self-pay | Admitting: *Deleted

## 2016-06-17 ENCOUNTER — Inpatient Hospital Stay (HOSPITAL_COMMUNITY): Payer: Self-pay

## 2016-06-17 NOTE — NC FL2 (Signed)
[place] MEDICAID FL2 LEVEL OF CARE SCREENING TOOL     IDENTIFICATION  Patient Name: Zachary Ho Birthdate: 1970/12/22 Sex: male Admission Date (Current Location): 06/13/2016  [facility] and [place] Number:  Producer, television/film/video and Address:  [facility], [address], [place], [place] [address]      Provider Number: [id number]  Attending Physician Name and Address:  Md, Trauma, MD  Relative Name and Phone Number:       Current Level of Care: Hospital Recommended Level of Care: Skilled Nursing Facility Prior Approval Number:    Date Approved/Denied: 06/17/16 PASRR Number: [id number]  Discharge Plan: SNF    Current Diagnoses: Patient Active Problem List   Diagnosis Date Noted  . Tibia/fibula fracture, shaft, left, open type I or II, initial encounter 06/13/2016    Orientation RESPIRATION BLADDER Height & Weight     Self, Time, Situation, Place  Normal Continent Weight: 150 lb (68 kg) Height:  5\' 7"  (170.2 cm)  BEHAVIORAL SYMPTOMS/MOOD NEUROLOGICAL BOWEL NUTRITION STATUS      Continent Diet (See DC summary)  AMBULATORY STATUS COMMUNICATION OF NEEDS Skin   Limited Assist Verbally Surgical wounds (Left Leg Closed Incision with compression wrap)                       Personal Care Assistance Level of Assistance  Bathing, Feeding, Dressing Bathing Assistance: Limited assistance Feeding assistance: Independent Dressing Assistance: Limited assistance     Functional Limitations Info  Sight, Hearing, Speech Sight Info: Adequate Hearing Info: Adequate Speech Info: Adequate    SPECIAL CARE FACTORS FREQUENCY  PT (By licensed PT), OT (By licensed OT)     PT Frequency: 5xweek OT Frequency: 5xweek            Contractures      Additional Factors Info  Code Status, Allergies Code Status Info: Full  Allergies Info: NKA           Current Medications (06/17/2016):  This is the current hospital active medication list Current  Facility-Administered Medications  Medication Dose Route Frequency Provider Last Rate Last Dose  . acetaminophen (TYLENOL) tablet 650 mg  650 mg Oral Q6H PRN Durene Romans, MD   650 mg at 06/17/16 3244   Or  . acetaminophen (TYLENOL) suppository 650 mg  650 mg Rectal Q6H PRN Durene Romans, MD      . cephALEXin (KEFLEX) capsule 500 mg  500 mg Oral Q6H Durene Romans, MD   500 mg at 06/17/16 1213  . docusate sodium (COLACE) capsule 100 mg  100 mg Oral BID Durene Romans, MD   100 mg at 06/17/16 1004  . folic acid (FOLVITE) tablet 1 mg  1 mg Oral Daily Manus Rudd, MD   1 mg at 06/17/16 1004  . HYDROmorphone (DILAUDID) injection 1-2 mg  1-2 mg Intravenous Q4H PRN Jimmye Norman, MD   1 mg at 06/15/16 1245  . methocarbamol (ROBAXIN) tablet 500 mg  500 mg Oral Q6H PRN Durene Romans, MD   500 mg at 06/17/16 0102   Or  . methocarbamol (ROBAXIN) 500 mg in dextrose 5 % 50 mL IVPB  500 mg Intravenous Q6H PRN Durene Romans, MD      . metoCLOPramide (REGLAN) tablet 5-10 mg  5-10 mg Oral Q8H PRN Durene Romans, MD       Or  . metoCLOPramide (REGLAN) injection 5-10 mg  5-10 mg Intravenous Q8H PRN Durene Romans, MD      .  multivitamin with minerals tablet 1 tablet  1 tablet Oral Daily Manus Ruddsuei, Matthew, MD   1 tablet at 06/17/16 1003  . ondansetron (ZOFRAN) tablet 4 mg  4 mg Oral Q6H PRN Durene Romanslin, Matthew, MD       Or  . ondansetron [facility](ZOFRAN) injection 4 mg  4 mg Intravenous Q6H PRN Durene Romanslin, Matthew, MD      . oxyCODONE (Oxy IR/ROXICODONE) immediate release tablet 5-10 mg  5-10 mg Oral Q4H PRN Jimmye NormanWyatt, James, MD   5 mg at 06/17/16 1012  . thiamine (VITAMIN B-1) tablet 100 mg  100 mg Oral Daily Manus Ruddsuei, Matthew, MD   100 mg at 06/17/16 1004     Discharge Medications: Please see discharge summary for a list of discharge medications.  Relevant Imaging Results:  Relevant Lab Results:   Additional Information [id number]  Tresa MoorePatricia V Aalayah Riles, LCSW
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
## 2016-06-17 NOTE — Progress Notes (Signed)
PT Cancellation Note  Patient Details Name: Zachary Ho MRN: [id number] DOB: September 01, 1970   Cancelled Treatment:    Reason Eval/Treat Not Completed: Other (comment)The  Patient is ambulating ad lib in the room with RW, NWB Left leg- observed by PT. The patient reports that he is going back to live in [place] tent in [place]- He is not sure if he has a RW.  Noted orders for CAM boot. Will check back after it is in place and evaluate his gait safety.  OxfordKaren Makynlie Rossini PT [phone]  Rada HayHill, Catera Hankins Elizabeth 06/17/2016, 12:37 PM

## 2016-06-17 NOTE — Progress Notes (Signed)
Patient ID: Bennie Pieriniimothy Curnutt, male   DOB: [birth year], 46 y.o.   MRN: [id number] Subjective: 4 Days Post-Op Procedure(s) (LRB): INTRAMEDULLARY (IM) NAIL TIBIAL (Left) IRRIGATION AND DEBRIDEMENT EXTREMITY (Left)    Patient reports pain as mild tolerating course well thus far. Afebrile past 24 hrs  Objective:   VITALS:   Vitals:   06/17/16 0650 06/17/16 1002  BP: 140/81 117/78  Pulse: 95 84  Resp: 16 18  Temp: 98.2 F (36.8 C) 98 F (36.7 C)    Neurovascular intact  Left leg compartments soft Wounds healing and stable for now, particularly at open fracture site, no duskiness, min erythema, minimal bloody drainage  LABS No results for input(s): HGB, HCT, WBC, PLT in the last 72 hours.  No results for input(s): NA, K, BUN, CREATININE, GLUCOSE in the last 72 hours.  No results for input(s): LABPT, INR in the last 72 hours.   Assessment/Plan: 4 Days Post-Op Procedure(s) (LRB): INTRAMEDULLARY (IM) NAIL TIBIAL (Left) IRRIGATION AND DEBRIDEMENT EXTREMITY (Left)   Plan: Stop nicotine patch Wound re-dressed with Montez MoritaKeith Paul, PA-C Ordered Cam Walker to keep wound environment stable and allow for some PWB Handy to see tomorrow to establish care team for follow up due to high risk of non-union versus infected non-union Maintain PO antibiotics for now through follow-up established by Handy PWB LLE permitted in boot

## 2016-06-17 NOTE — Progress Notes (Signed)
PT Cancellation Note  Patient Details Name: Zachary Ho MRN: [id number] DOB: [birth year]   Cancelled Treatment:    Reason Eval/Treat Not Completed: Other (comment) Does not  Have CAM walker on at this time. Will check back tomorrow  .   Rada HayHill, Olina Melfi Elizabeth 06/17/2016, 2:54 PM Blanchard KelchKaren Kartier Bennison PT [phone]

## 2016-06-17 NOTE — Progress Notes (Signed)
Trauma Service Note  Subjective: Patient is doing fine   Objective: Vital signs in last 24 hours: Temp:  [97.7 F (36.5 C)-98.2 F (36.8 C)] 98 F (36.7 C) (05/28 1002) Pulse Rate:  [84-96] 84 (05/28 1002) Resp:  [16-18] 18 (05/28 1002) BP: (117-149)/(78-83) 117/78 (05/28 1002) SpO2:  [100 %] 100 % (05/28 1002) Last BM Date: 06/16/16  Intake/Output from previous day: 05/27 0701 - 05/28 0700 In: -  Out: 400 [Urine:400] Intake/Output this shift: No intake/output data recorded.  General: No distress.   Lungs: Clear  Abd: soft, benign  Extremities: No changes  Neuro: Intact  Lab Results: CBC  No results for input(s): WBC, HGB, HCT, PLT in the last 72 hours. BMET No results for input(s): NA, K, CL, CO2, GLUCOSE, BUN, CREATININE, CALCIUM in the last 72 hours. PT/INR No results for input(s): LABPROT, INR in the last 72 hours. ABG No results for input(s): PHART, HCO3 in the last 72 hours.  Invalid input(s): PCO2, PO2  Studies/Results: No results found.  Anti-infectives: Anti-infectives    Start     Dose/Rate Route Frequency Ordered Stop   06/16/16 0730  cephALEXin (KEFLEX) capsule 500 mg     500 mg Oral Every 6 hours 06/16/16 0721     06/14/16 0400  ceFAZolin (ANCEF) IVPB 1 g/50 mL premix     1 g 100 mL/hr over 30 Minutes Intravenous Every 6 hours 06/14/16 0351 06/16/16 0005   06/13/16 2215  ceFAZolin (ANCEF) IVPB 2g/100 mL premix     2 g 200 mL/hr over 30 Minutes Intravenous  Once 06/13/16 2204 06/13/16 2245      Assessment/Plan: s/p Procedure(s): INTRAMEDULLARY (IM) NAIL TIBIAL IRRIGATION AND DEBRIDEMENT EXTREMITY Patient to be seen by Dr. Carola FrostHandy tomorrow. for further evaluation.  Probably home tomorrow.   LOS: 4 days   Marta LamasJames O. Gae BonWyatt, III, MD, FACS [phone] Trauma Surgeon 06/17/2016

## 2016-06-17 NOTE — Social Work (Signed)
CSW obtained 2 week LOG from clinical supervisor. [facility]W contacted [facility] and they indicated they would review and lest CSW know if they can make a bed offer. CSW will f/u.
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
## 2016-06-18 ENCOUNTER — Encounter (HOSPITAL_COMMUNITY): Payer: Self-pay | Admitting: Orthopedic Surgery

## 2016-06-18 MED ORDER — NICOTINE 14 MG/24HR TD PT24
14.0000 mg | MEDICATED_PATCH | Freq: Every day | TRANSDERMAL | Status: DC
  Administered 2016-06-18 – 2016-06-19 (×2): 14 mg via TRANSDERMAL
  Filled 2016-06-18 (×2): qty 1

## 2016-06-18 MED ORDER — ENOXAPARIN SODIUM 40 MG/0.4ML ~~LOC~~ SOLN
40.0000 mg | Freq: Every day | SUBCUTANEOUS | Status: DC
  Administered 2016-06-19: 40 mg via SUBCUTANEOUS
  Filled 2016-06-18: qty 0.4

## 2016-06-18 NOTE — Progress Notes (Signed)
Orthopedic Tech Progress Note Patient Details:  Zachary Ho Mar 25, 1970 [id number]  Zachary Ho Zachary Ho Zachary Ho   Zachary Ho 06/18/2016, 7:55 AM
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
## 2016-06-18 NOTE — Discharge Instructions (Signed)
Orthopaedic Trauma Service Discharge Instructions   General Discharge Instructions  WEIGHT BEARING STATUS: Partial weightbearing left leg, approximately 50% of body weight  RANGE OF MOTION/ACTIVITY: Unrestricted knee range of motion. The boot on at all times when weightbearing. Okay to remove boot to work on ankle motion.  Wound Care: Keep wound as clean as possible. Use soap and water to clean wound daily. Do not put any ointments or lotions on wound. Keep wound dressed with clean dressing  Discharge Wound Care Instructions  Do NOT apply any ointments, solutions or lotions to pin sites or surgical wounds.  These prevent needed drainage and even though solutions like hydrogen peroxide kill bacteria, they also damage cells lining the pin sites that help fight infection.  Applying lotions or ointments can keep the wounds moist and can cause them to breakdown and open up as well. This can increase the risk for infection. When in doubt call the office.  Surgical incisions should be dressed daily.  If any drainage is noted, use one layer of adaptic, then gauze, Kerlix, and an ace wrap.  Once the incision is completely dry and without drainage, it may be left open to air out.  Showering may begin 36-48 hours later.  Cleaning gently with soap and water.  Traumatic wounds should be dressed daily as well.    One layer of adaptic, gauze, Kerlix, then ace wrap.  The adaptic can be discontinued once the draining has ceased    If you have a wet to dry dressing: wet the gauze with saline the squeeze as much saline out so the gauze is moist (not soaking wet), place moistened gauze over wound, then place a dry gauze over the moist one, followed by Kerlix wrap, then ace wrap.  PAIN MEDICATION USE AND EXPECTATIONS  You have likely been given narcotic medications to help control your pain.  After a traumatic event that results in an fracture (broken bone) with or without surgery, it is ok to use narcotic  pain medications to help control one's pain.  We understand that everyone responds to pain differently and each individual patient will be evaluated on a regular basis for the continued need for narcotic medications. Ideally, narcotic medication use should last no more than 6-8 weeks (coinciding with fracture healing).   As a patient it is your responsibility as well to monitor narcotic medication use and report the amount and frequency you use these medications when you come to your office visit.   We would also advise that if you are using narcotic medications, you should take a dose prior to therapy to maximize you participation.  IF YOU ARE ON NARCOTIC MEDICATIONS IT IS NOT PERMISSIBLE TO OPERATE A MOTOR VEHICLE (MOTORCYCLE/CAR/TRUCK/MOPED) OR HEAVY MACHINERY DO NOT MIX NARCOTICS WITH OTHER CNS (CENTRAL NERVOUS SYSTEM) DEPRESSANTS SUCH AS ALCOHOL  Diet: as you were eating previously.  Can use over the counter stool softeners and bowel preparations, such as Miralax, to help with bowel movements.  Narcotics can be constipating.  Be sure to drink plenty of fluids    STOP SMOKING OR USING NICOTINE PRODUCTS!!!!  As discussed nicotine severely impairs your body's ability to heal surgical and traumatic wounds but also impairs bone healing.  Wounds and bone heal by forming microscopic blood vessels (angiogenesis) and nicotine is a vasoconstrictor (essentially, shrinks blood vessels).  Therefore, if vasoconstriction occurs to these microscopic blood vessels they essentially disappear and are unable to deliver necessary nutrients to the healing tissue.  This is one  modifiable factor that you can do to dramatically increase your chances of healing your injury.    (This means no smoking, no nicotine gum, patches, etc)  DO NOT USE NONSTEROIDAL ANTI-INFLAMMATORY DRUGS (NSAID'S)  Using products such as Advil (ibuprofen), Aleve (naproxen), Motrin (ibuprofen) for additional pain control during fracture healing  can delay and/or prevent the healing response.  If you would like to take over the counter (OTC) medication, Tylenol (acetaminophen) is ok.  However, some narcotic medications that are given for pain control contain acetaminophen as well. Therefore, you should not exceed more than 4000 mg of tylenol in a day if you do not have liver disease.  Also note that there are may OTC medicines, such as cold medicines and allergy medicines that my contain tylenol as well.  If you have any questions about medications and/or interactions please ask your doctor/PA or your pharmacist.      ICE AND ELEVATE INJURED/OPERATIVE EXTREMITY  Using ice and elevating the injured extremity above your heart can help with swelling and pain control.  Icing in a pulsatile fashion, such as 20 minutes on and 20 minutes off, can be followed.    Do not place ice directly on skin. Make sure there is a barrier between to skin and the ice pack.    Using frozen items such as frozen peas works well as the conform nicely to the are that needs to be iced.  USE AN ACE WRAP OR TED HOSE FOR SWELLING CONTROL  In addition to icing and elevation, Ace wraps or TED hose are used to help limit and resolve swelling.  It is recommended to use Ace wraps or TED hose until you are informed to stop.    When using Ace Wraps start the wrapping distally (farthest away from the body) and wrap proximally (closer to the body)   Example: If you had surgery on your leg or thing and you do not have a splint on, start the ace wrap at the toes and work your way up to the thigh        If you had surgery on your upper extremity and do not have a splint on, start the ace wrap at your fingers and work your way up to the upper arm  IF YOU ARE IN A SPLINT OR CAST DO NOT REMOVE IT FOR ANY REASON   If your splint gets wet for any reason please contact the office immediately. You may shower in your splint or cast as long as you keep it dry.  This can be done by wrapping in a  cast cover or garbage back (or similar)  Do Not stick any thing down your splint or cast such as pencils, money, or hangers to try and scratch yourself with.  If you feel itchy take benadryl as prescribed on the bottle for itching  IF YOU ARE IN A CAM BOOT (BLACK BOOT)  You may remove boot periodically. Perform daily dressing changes as noted below.  Wash the liner of the boot regularly and wear a sock when wearing the boot. It is recommended that you sleep in the boot until told otherwise  CALL THE OFFICE WITH ANY QUESTIONS OR CONCERNS: [phone]
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
## 2016-06-19 LAB — CBC
HCT: 36.6 % — ABNORMAL LOW (ref 39.0–52.0)
HEMOGLOBIN: 12.1 g/dL — AB (ref 13.0–17.0)
MCH: 31.3 pg (ref 26.0–34.0)
MCHC: 33.1 g/dL (ref 30.0–36.0)
MCV: 94.6 fL (ref 78.0–100.0)
PLATELETS: 295 10*3/uL (ref 150–400)
RBC: 3.87 MIL/uL — ABNORMAL LOW (ref 4.22–5.81)
RDW: 12.9 % (ref 11.5–15.5)
WBC: 6.3 10*3/uL (ref 4.0–10.5)

## 2016-06-19 LAB — SURGICAL PCR SCREEN
MRSA, PCR: NEGATIVE
Staphylococcus aureus: NEGATIVE

## 2016-06-19 MED ORDER — ASPIRIN EC 325 MG PO TBEC: 325.0000 mg | DELAYED_RELEASE_TABLET | Freq: Two times a day (BID) | ORAL | 0 refills | Status: AC

## 2016-06-19 MED ORDER — OXYCODONE HCL 5 MG PO TABS: 5.0000 mg | ORAL_TABLET | ORAL | 0 refills | Status: AC | PRN

## 2016-06-19 MED ORDER — CEPHALEXIN 500 MG PO CAPS: 500.0000 mg | ORAL_CAPSULE | Freq: Four times a day (QID) | ORAL | 0 refills | Status: AC

## 2016-06-19 MED ORDER — METHOCARBAMOL 500 MG PO TABS: 500.0000 mg | ORAL_TABLET | Freq: Four times a day (QID) | ORAL | 0 refills | Status: AC | PRN

## 2016-06-19 NOTE — Progress Notes (Signed)
Discharging patient home. Verbalized understanding of discharge instructions (written and verbal). Leaving via taxi.; written prescriptions given in discharge packet.

## 2016-06-19 NOTE — Progress Notes (Signed)
Pt is uninsured and homeless.  He is eligible for medication assistance through [facility] for all discharge medications.    Quintella BatonJulie W. Mariangel Ringley, RN, BSN  Trauma/Neuro ICU Case Manager [phone]
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
## 2016-06-19 NOTE — Progress Notes (Signed)
Discharge instructions and RX reviewed with pt. Pt left with walker refusing wheelchair. Pt left in NAD with cab voucher and belongings in hands.
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
## 2016-06-19 NOTE — Social Work (Signed)
CSW met with patient on 06/18/16 and discussed placement options at SNF in [place]. CSW advised that we had placement at [facility] for rehab. Patient indicated that he will declined SNF in [place] as he wants to be closer to home and his supports in [place].  CSW f/u on community resources and he declined all.   Clinical Social Worker will sign off for now as social work intervention is no longer needed. Please consult [place] again if new need arises.  Elissa Hefty, LCSW Clinical Social Worker [phone]

## 2016-06-19 NOTE — Social Work (Addendum)
CSW obtained approval for taxi cab [place]. Patient ambulating well, walked 50 feet and able to use tax cab for transport. Making great progress per PT notes.  Discussed with RNCM.  Clinical Social Worker will sign off for now as social work intervention is no longer needed. Please consult us again if new need arises.  Keene BreathPatricia Fredrick Dray, LCSW Clinical Social Worker [phone]

## 2016-06-24 NOTE — Addendum Note (Signed)
Addendum  created 06/24/16 1604 by Val EagleMoser, Kaliann Coryell, MD   Sign clinical note
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
## 2018-01-11 IMAGING — RF DG C-ARM 61-120 MIN
1 series · 5 of 5 positions shown · non-contrast
Comparison: 06/13/2016

CLINICAL DATA: Intramedullary nail to the left lower leg with
irrigation and debridement.

EXAM:
DG C-ARM 61-120 MIN; LEFT TIBIA AND FIBULA - 2 VIEW

[Series 1: run · 5 of 5 slices shown]
[im 1/5]
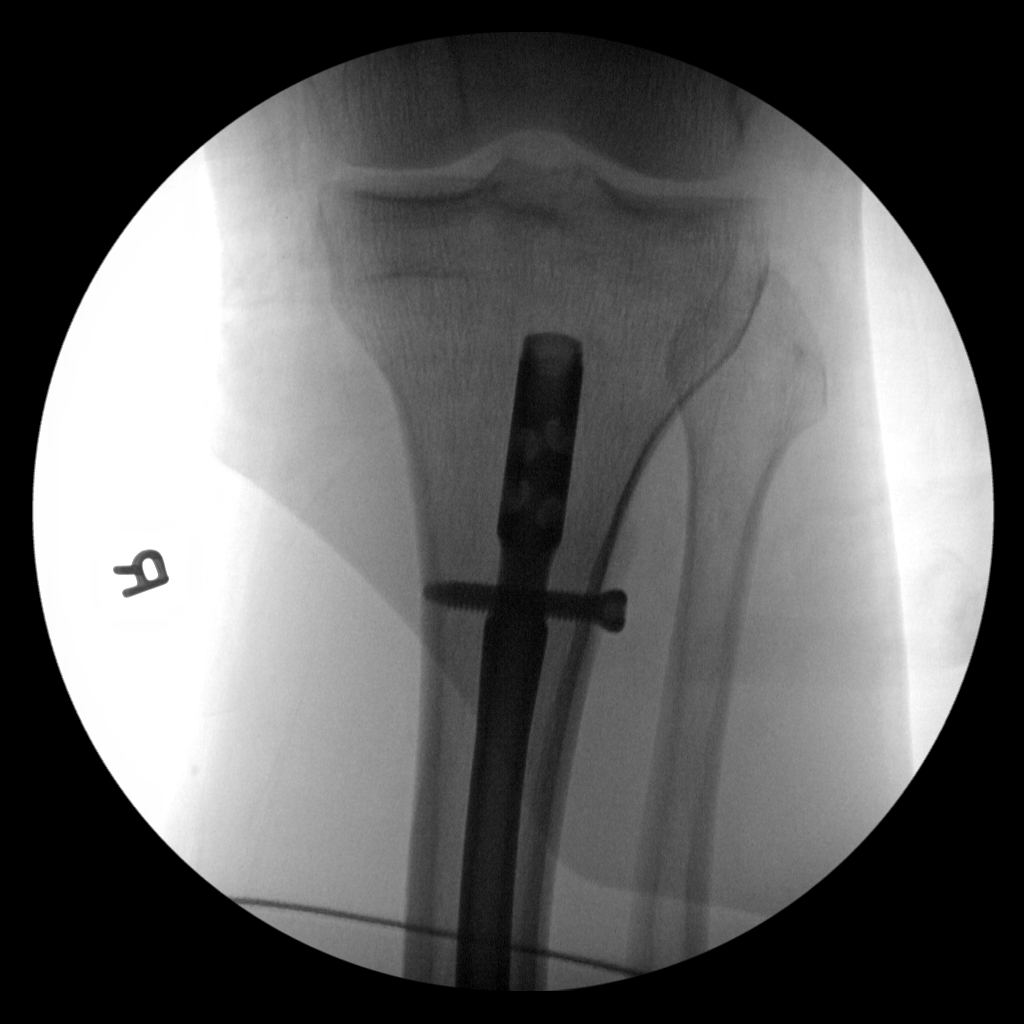
[im 2/5]
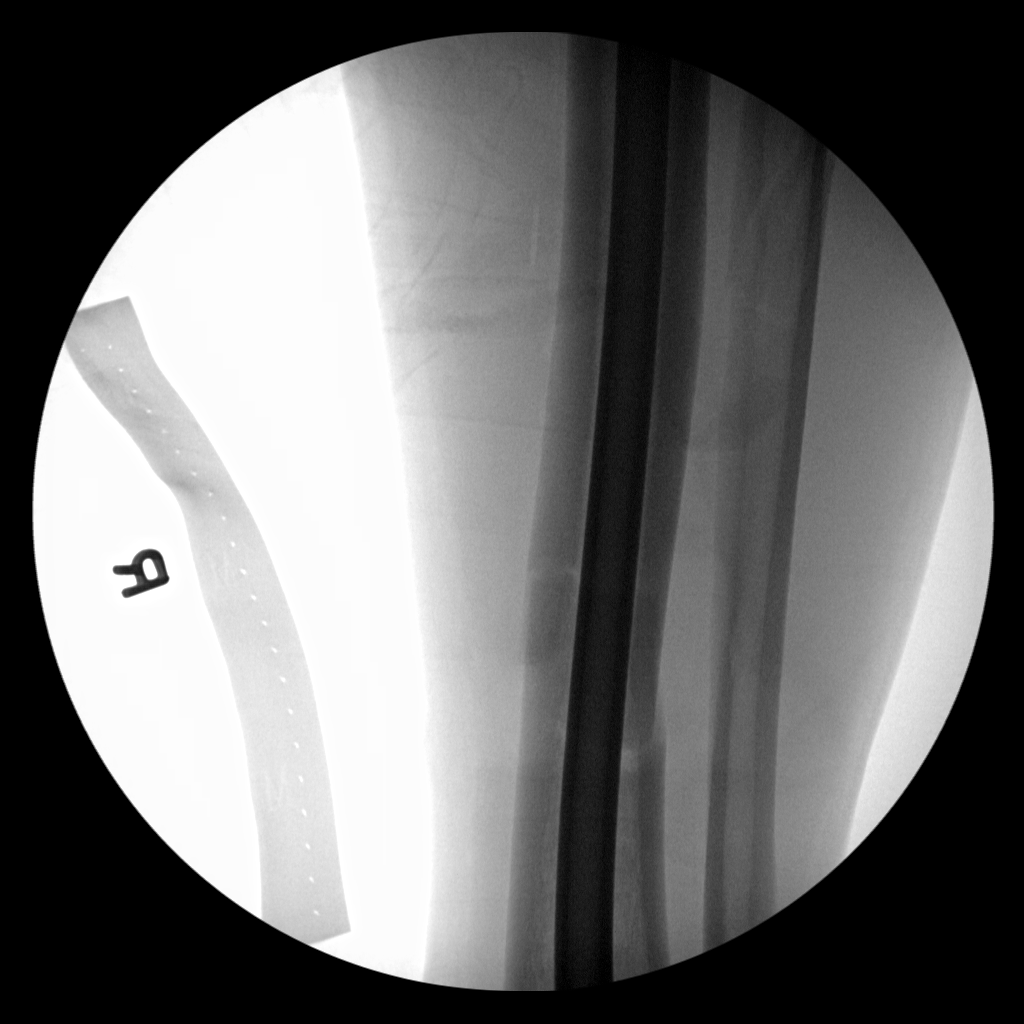
[im 3/5]
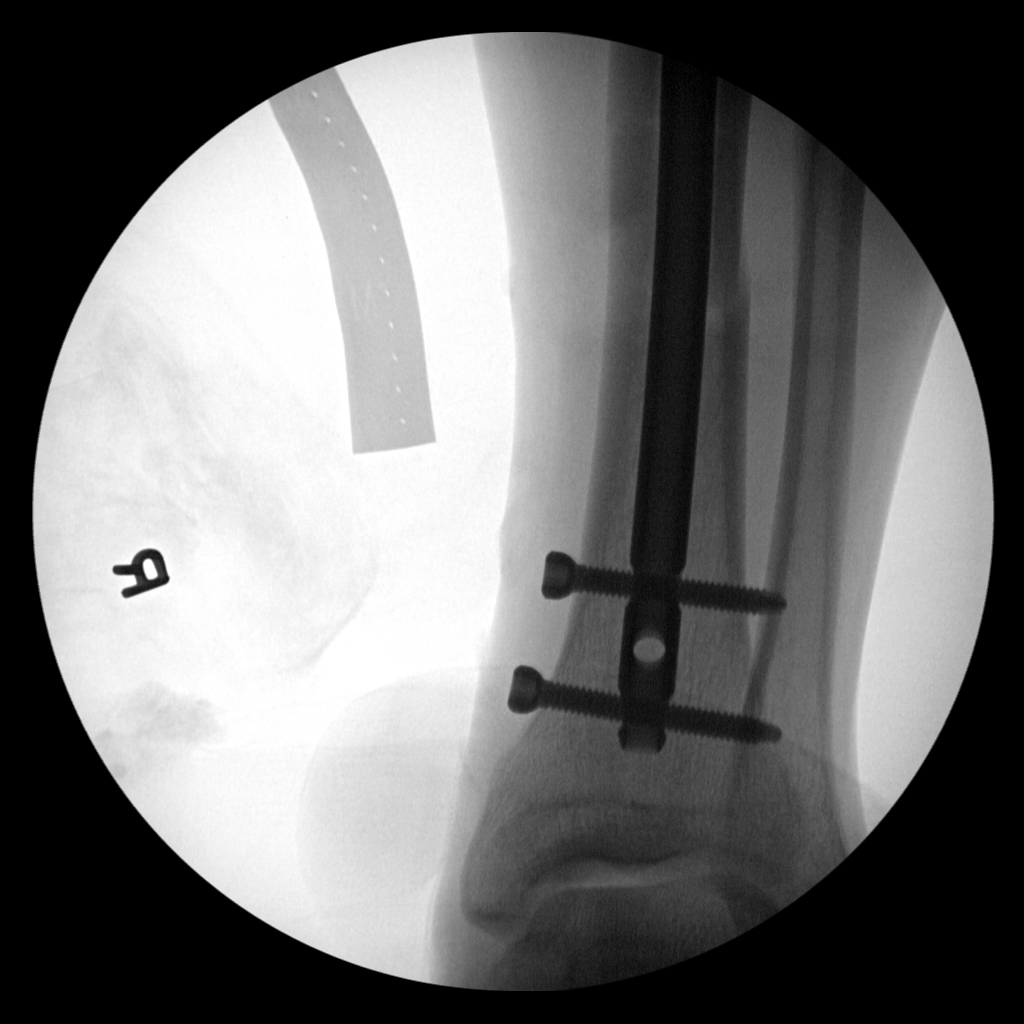
[im 4/5]
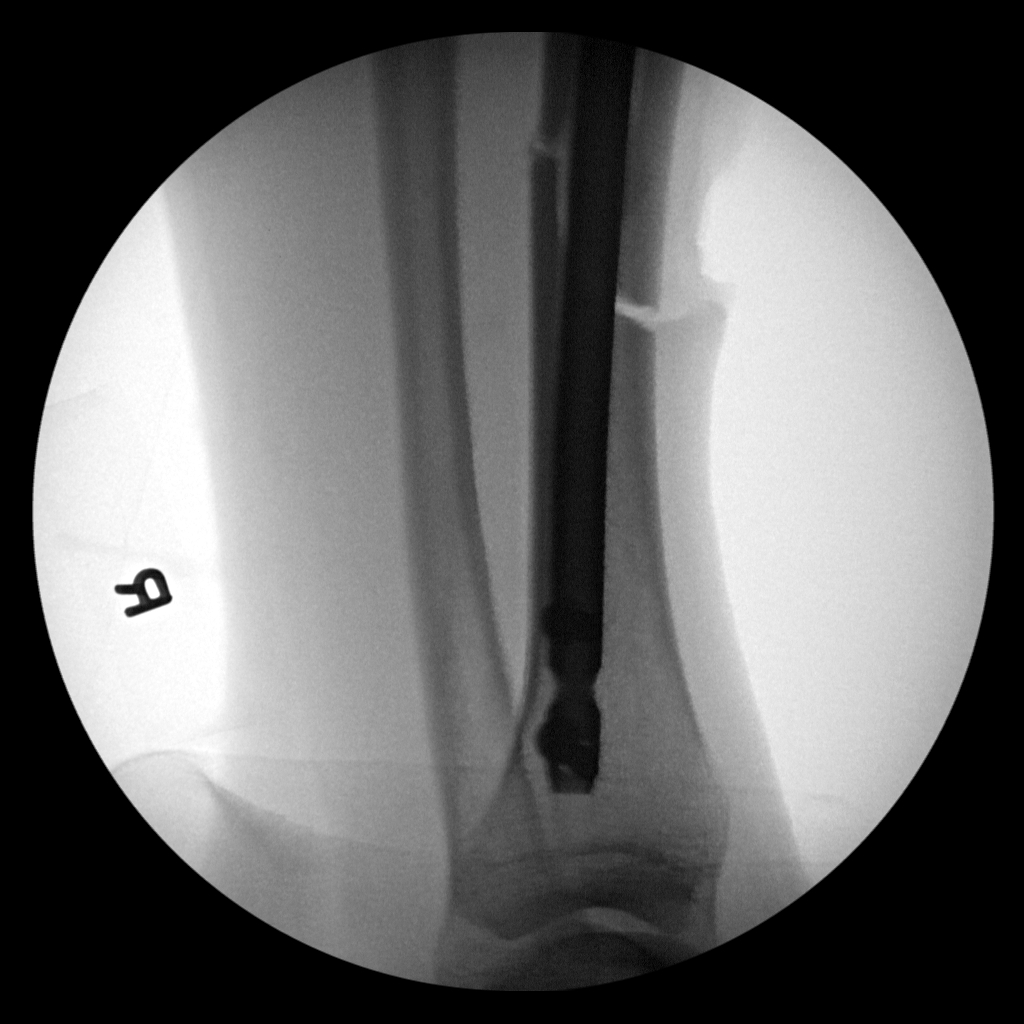
[im 5/5]
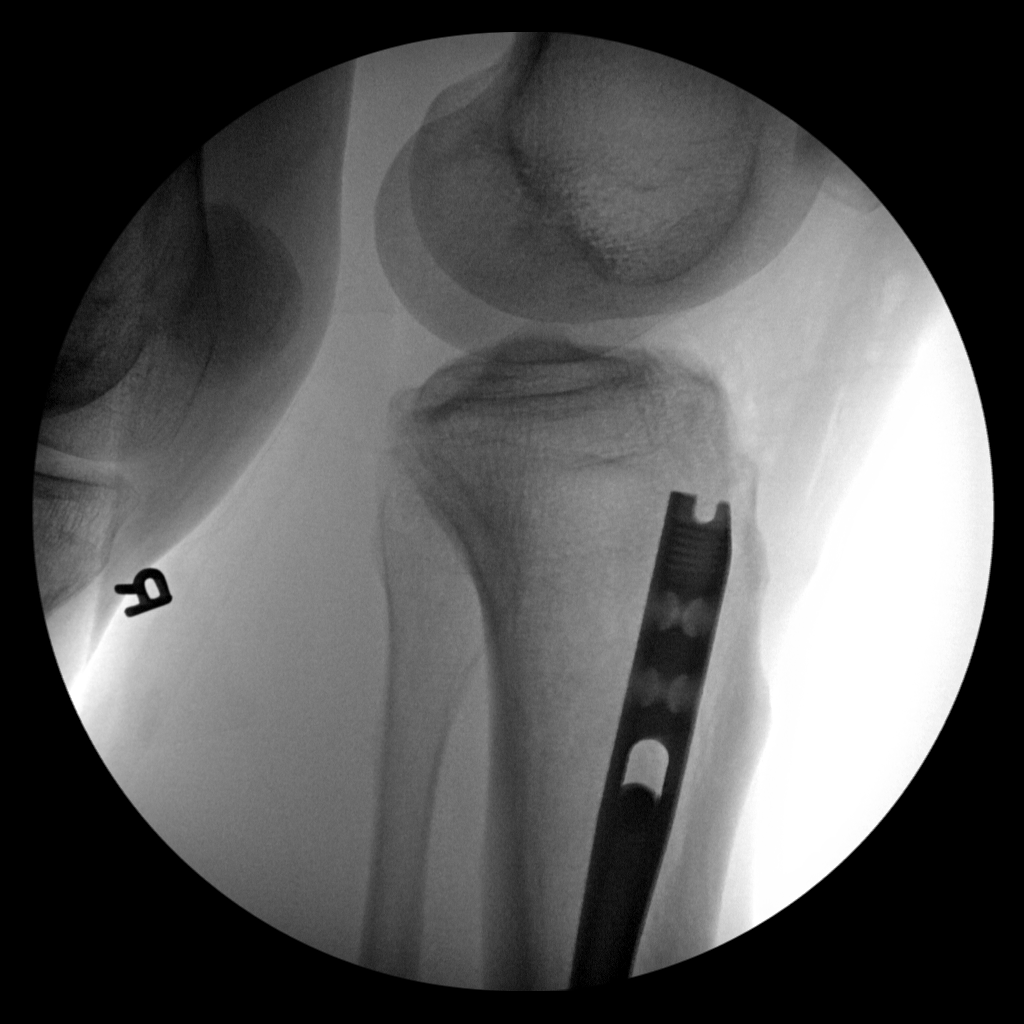

[5 of 5 positions shown; findings below may reference images not displayed]

FINDINGS: Intraoperative fluoroscopy is obtained for surgical control
purposes. Fluoroscopy time is recorded at 2 minute 6 seconds. Five
spot fluoroscopic images are obtained.

Spot fluoroscopic images obtained demonstrate interval internal
fixation of fractures of the mid and distal shaft of the left tibia
using an intramedullary rod with proximal and distal locking screw
fixation. There is improved alignment and position of fracture
fragments since the preoperative study. Fractures of the midshaft
left fibula are incompletely included but appear to be and near
anatomic alignment and position as well.
IMPRESSION: Intraoperative fluoroscopy utilized for surgical control purposes
demonstrating intramedullary rod fixation of a fracture of the left
tibia.
